# Patient Record
Sex: Male | Born: 1960 | Race: White | Hispanic: No | Marital: Single
Health system: Southern US, Community
[De-identification: ages and names within clinical notes are randomized; demographics above are authoritative.]

---

## 2020-08-19 ENCOUNTER — Emergency Department (HOSPITAL_BASED_OUTPATIENT_CLINIC_OR_DEPARTMENT_OTHER)
Admission: EM | Admit: 2020-08-19 | Discharge: 2020-08-19 | Disposition: A | Discharge status: Home / Self Care | Payer: Self-pay | Attending: Emergency Medicine | Admitting: Emergency Medicine

## 2020-08-19 ENCOUNTER — Emergency Department (HOSPITAL_BASED_OUTPATIENT_CLINIC_OR_DEPARTMENT_OTHER): Payer: Self-pay

## 2020-08-19 ENCOUNTER — Encounter (HOSPITAL_BASED_OUTPATIENT_CLINIC_OR_DEPARTMENT_OTHER): Payer: Self-pay | Admitting: *Deleted

## 2020-08-19 ENCOUNTER — Other Ambulatory Visit: Payer: Self-pay

## 2020-08-19 DIAGNOSIS — Z5321 Procedure and treatment not carried out due to patient leaving prior to being seen by health care provider: Secondary | ICD-10-CM | POA: Insufficient documentation

## 2020-08-19 DIAGNOSIS — F10129 Alcohol abuse with intoxication, unspecified: Secondary | ICD-10-CM | POA: Insufficient documentation

## 2020-08-19 NOTE — ED Triage Notes (Signed)
Brought in by [facility] form local [place], for intoxication , unsteady gait. Pt states " im am drunk" . Slurred speech in triage

## 2020-08-19 NOTE — ED Notes (Signed)
V/o head ct

## 2020-12-19 DEATH — deceased

## 2021-12-29 IMAGING — CT CT HEAD W/O CM
3 of 4 series · 16 of 47 positions shown, 19 images · non-contrast
Comparison: None.

CLINICAL DATA: Unsteady gait.

EXAM:
CT HEAD WITHOUT CONTRAST
TECHNIQUE: Contiguous axial images were obtained from the base of the skull
through the vertex without intravenous contrast.

[Series 2: head wo · axial · 0.46mm/px · z∈[+1353,+1483]mm · 10 of 31 slices shown, 13 images]
[im 3/31  brain]
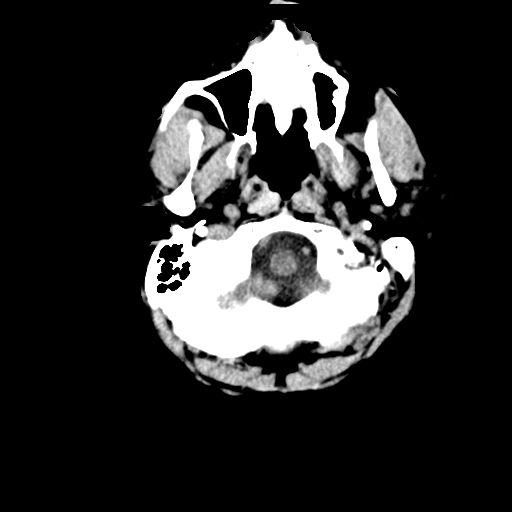
[im 3/31  bone]
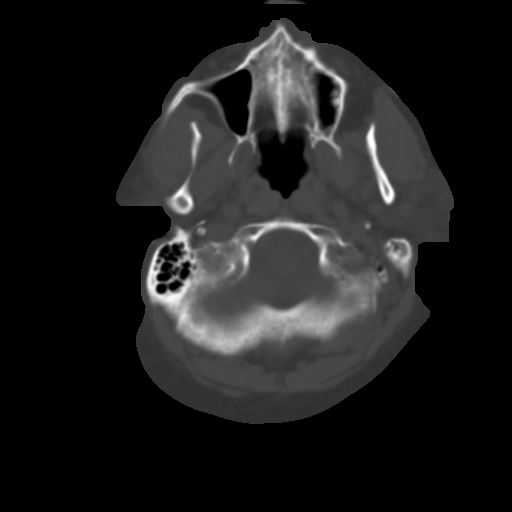
[im 6/31  brain]
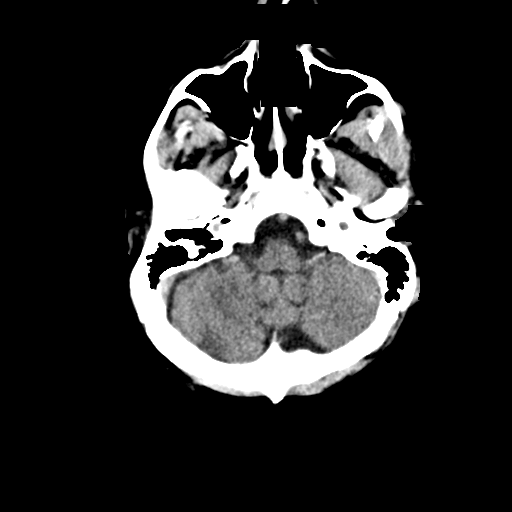
[im 9/31  brain]
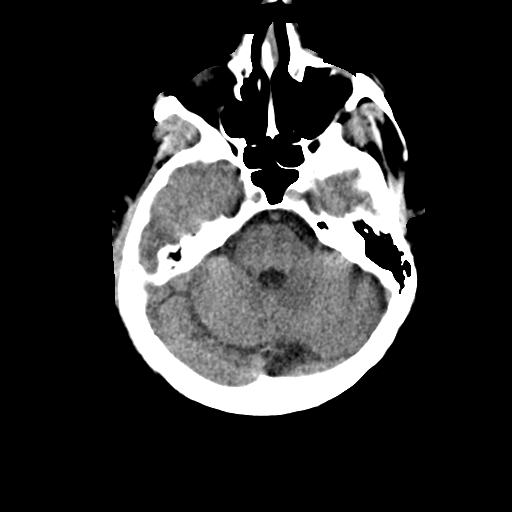
[im 12/31  brain]
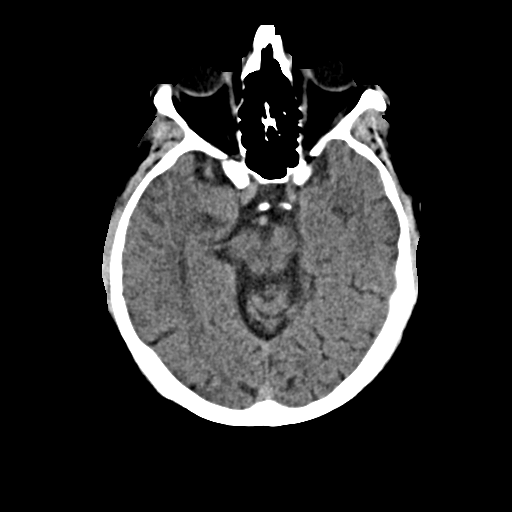
[im 15/31  brain]
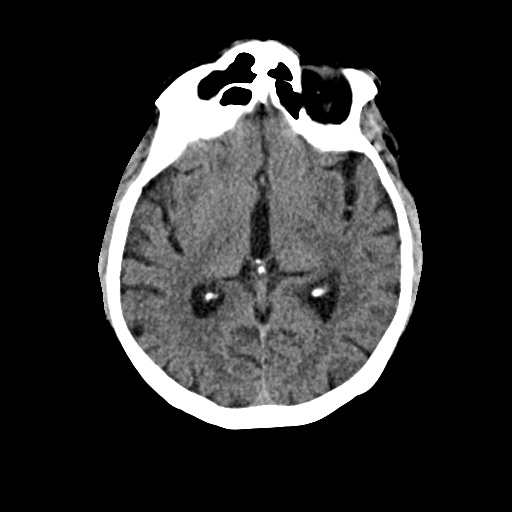
[im 15/31  bone]
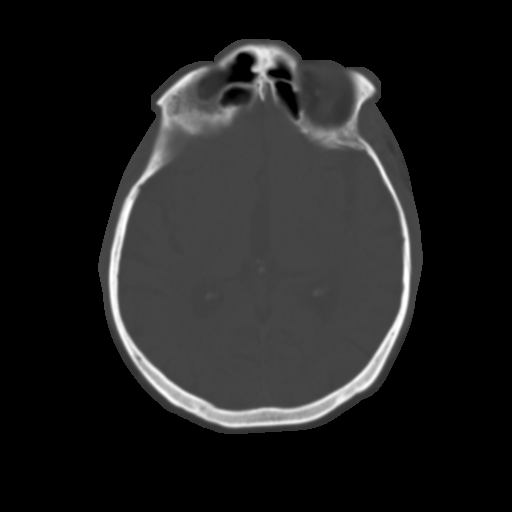
[im 18/31  brain]
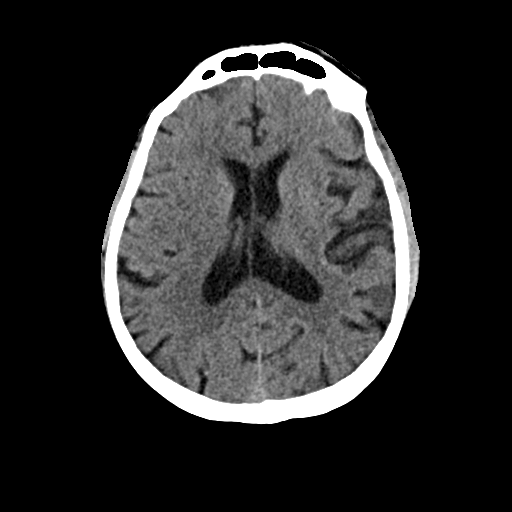
[im 21/31  brain]
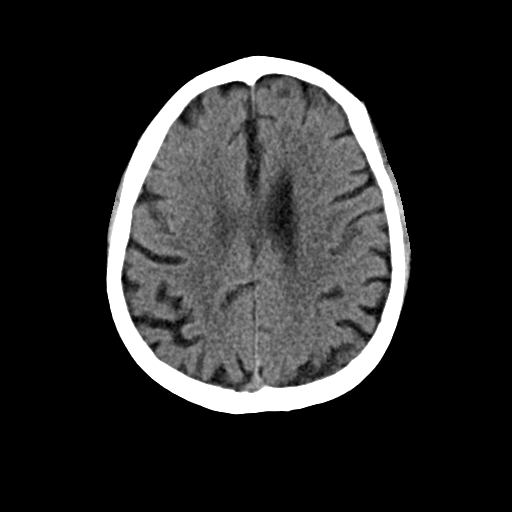
[im 23/31  brain]
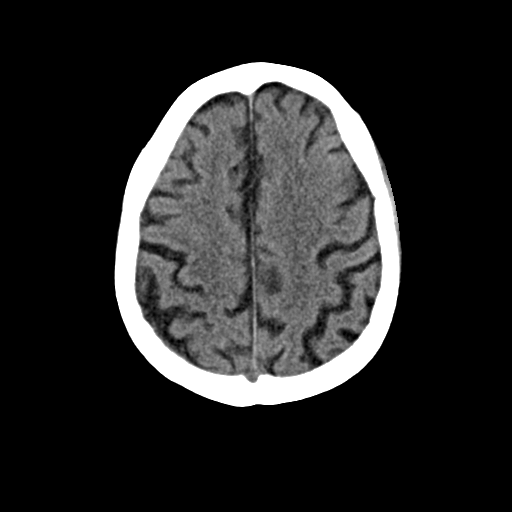
[im 26/31  brain]
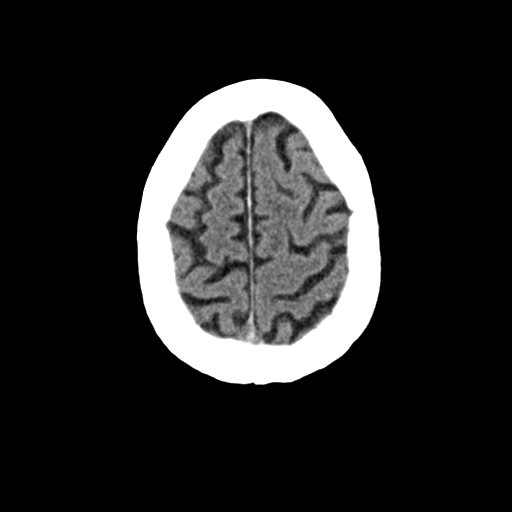
[im 26/31  bone]
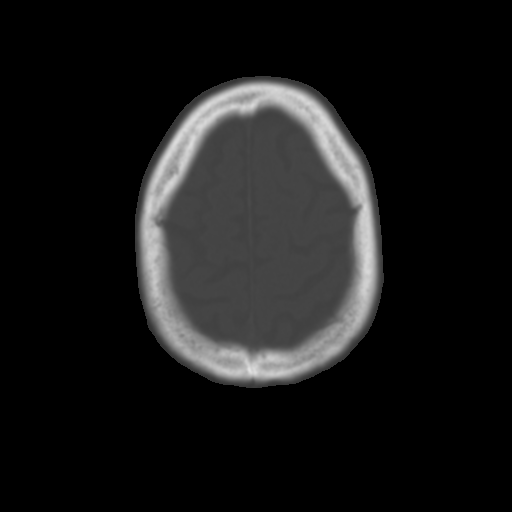
[im 29/31  brain]
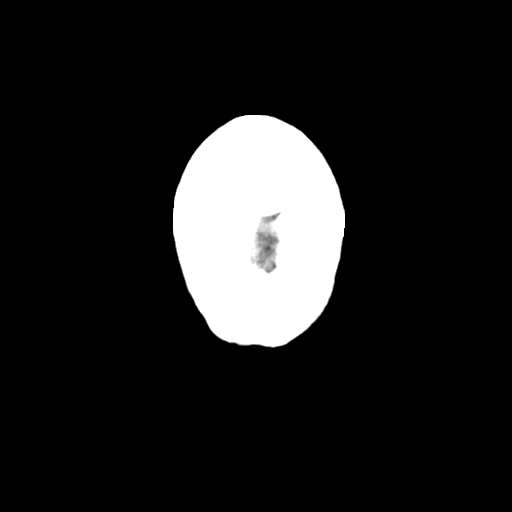

[Series 4: coronal soft · coronal · 0.34mm/px · 3 of 70 slices shown]
[im 24/70  brain]
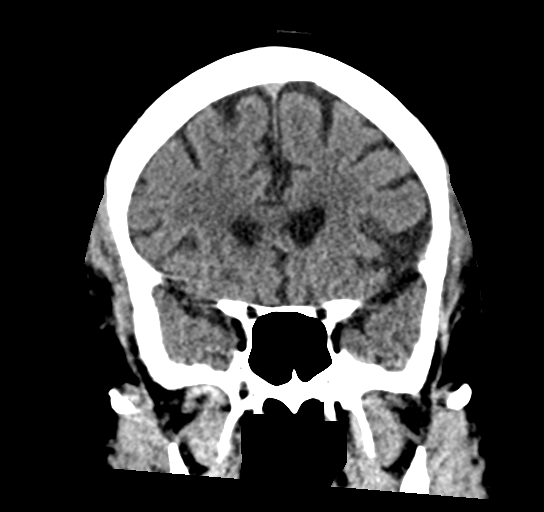
[im 31/70  brain]
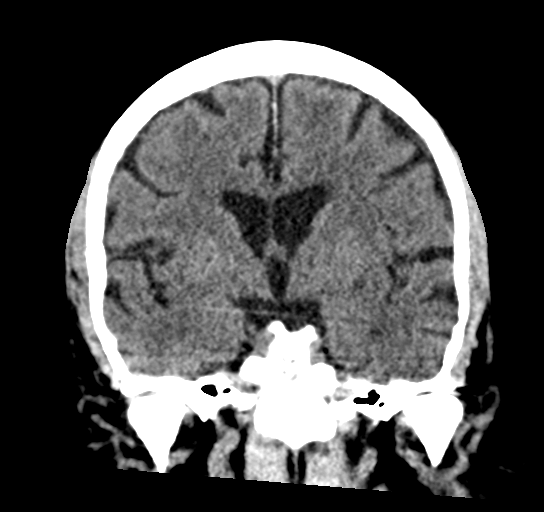
[im 39/70  brain]
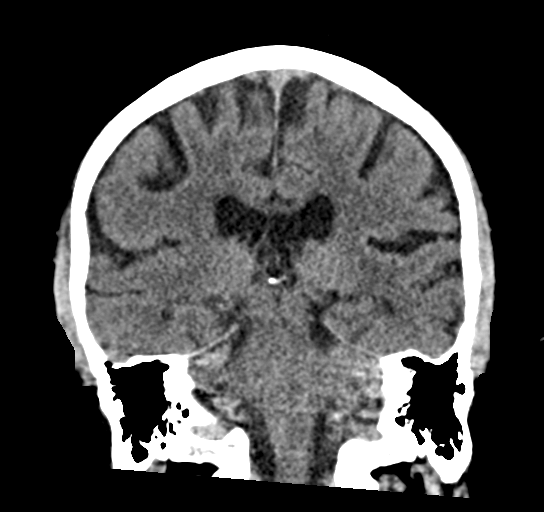

[Series 5: sag soft · sagittal · 0.36mm/px · 3 of 67 slices shown]
[im 23/67  brain]
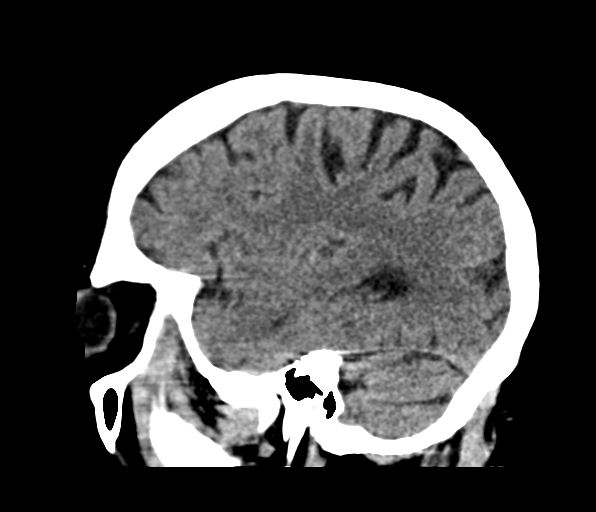
[im 34/67  brain]
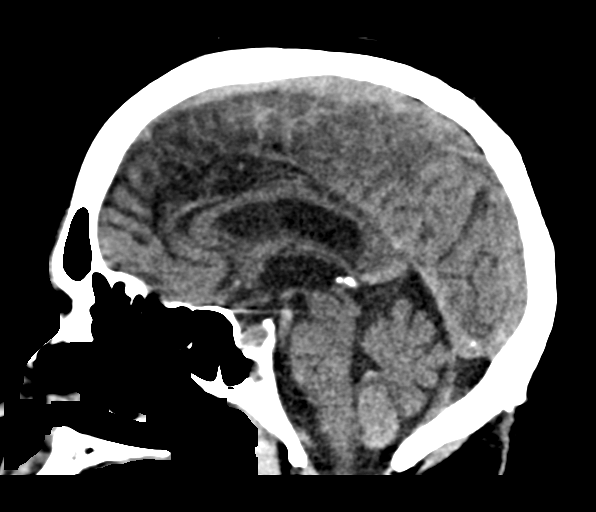
[im 45/67  brain]
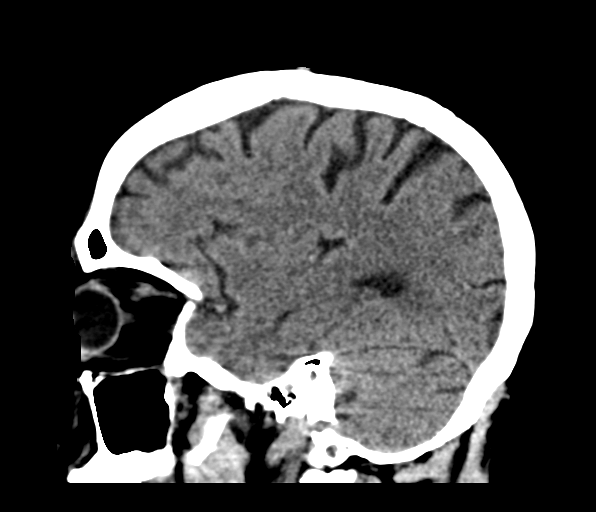

[16 of 47 positions shown; findings below may reference images not displayed]

FINDINGS: Brain: There is mild cerebral atrophy with widening of the
extra-axial spaces and ventricular dilatation.
There are areas of decreased attenuation within the white matter
tracts of the supratentorial brain, consistent with microvascular
disease changes.

Vascular: No hyperdense vessel or unexpected calcification.

Skull: Normal. Negative for fracture or focal lesion.

Sinuses/Orbits: No acute finding.

Other: None.
IMPRESSION: 1. Generalized cerebral atrophy.
2. No acute intracranial abnormality.
